# Patient Record
Sex: Female | Born: 1960 | Race: Black or African American | Hispanic: No | Marital: Single | Smoking: Never smoker
Health system: Southern US, Community
[De-identification: ages and names within clinical notes are randomized; demographics above are authoritative.]

## PROBLEM LIST (undated history)

## (undated) DIAGNOSIS — C801 Malignant (primary) neoplasm, unspecified: Secondary | ICD-10-CM

## (undated) HISTORY — PX: ABDOMINAL SURGERY: SHX537

## (undated) HISTORY — PX: ABDOMINAL HYSTERECTOMY: SHX81

---

## 2015-02-01 ENCOUNTER — Other Ambulatory Visit: Payer: Self-pay

## 2015-02-01 ENCOUNTER — Emergency Department (HOSPITAL_BASED_OUTPATIENT_CLINIC_OR_DEPARTMENT_OTHER): Payer: Medicaid Other

## 2015-02-01 ENCOUNTER — Emergency Department (HOSPITAL_BASED_OUTPATIENT_CLINIC_OR_DEPARTMENT_OTHER)
Admission: EM | Admit: 2015-02-01 | Discharge: 2015-02-01 | Disposition: A | Discharge status: Home / Self Care | Payer: Medicaid Other | Attending: Emergency Medicine | Admitting: Emergency Medicine

## 2015-02-01 ENCOUNTER — Encounter (HOSPITAL_BASED_OUTPATIENT_CLINIC_OR_DEPARTMENT_OTHER): Payer: Self-pay | Admitting: Emergency Medicine

## 2015-02-01 DIAGNOSIS — L509 Urticaria, unspecified: Secondary | ICD-10-CM | POA: Diagnosis not present

## 2015-02-01 DIAGNOSIS — Z8679 Personal history of other diseases of the circulatory system: Secondary | ICD-10-CM | POA: Insufficient documentation

## 2015-02-01 DIAGNOSIS — Z859 Personal history of malignant neoplasm, unspecified: Secondary | ICD-10-CM | POA: Insufficient documentation

## 2015-02-01 DIAGNOSIS — R0789 Other chest pain: Secondary | ICD-10-CM | POA: Insufficient documentation

## 2015-02-01 DIAGNOSIS — J029 Acute pharyngitis, unspecified: Secondary | ICD-10-CM | POA: Diagnosis present

## 2015-02-01 HISTORY — DX: Malignant (primary) neoplasm, unspecified: C80.1

## 2015-02-01 LAB — CBC WITH DIFFERENTIAL/PLATELET
BASOS PCT: 0 %
Basophils Absolute: 0 10*3/uL (ref 0.0–0.1)
EOS ABS: 0.1 10*3/uL (ref 0.0–0.7)
EOS PCT: 1 %
HCT: 37.3 % (ref 36.0–46.0)
Hemoglobin: 11.9 g/dL — ABNORMAL LOW (ref 12.0–15.0)
LYMPHS ABS: 3.5 10*3/uL (ref 0.7–4.0)
Lymphocytes Relative: 55 %
MCH: 28.7 pg (ref 26.0–34.0)
MCHC: 31.9 g/dL (ref 30.0–36.0)
MCV: 89.9 fL (ref 78.0–100.0)
MONOS PCT: 7 %
Monocytes Absolute: 0.5 10*3/uL (ref 0.1–1.0)
NEUTROS PCT: 37 %
Neutro Abs: 2.4 10*3/uL (ref 1.7–7.7)
PLATELETS: 220 10*3/uL (ref 150–400)
RBC: 4.15 MIL/uL (ref 3.87–5.11)
RDW: 13.5 % (ref 11.5–15.5)
WBC: 6.5 10*3/uL (ref 4.0–10.5)

## 2015-02-01 LAB — BASIC METABOLIC PANEL
ANION GAP: 5 (ref 5–15)
BUN: 13 mg/dL (ref 6–20)
CALCIUM: 8.7 mg/dL — AB (ref 8.9–10.3)
CO2: 26 mmol/L (ref 22–32)
CREATININE: 0.73 mg/dL (ref 0.44–1.00)
Chloride: 108 mmol/L (ref 101–111)
GLUCOSE: 98 mg/dL (ref 65–99)
Potassium: 3.6 mmol/L (ref 3.5–5.1)
Sodium: 139 mmol/L (ref 135–145)

## 2015-02-01 LAB — RAPID STREP SCREEN (MED CTR MEBANE ONLY): STREPTOCOCCUS, GROUP A SCREEN (DIRECT): NEGATIVE

## 2015-02-01 LAB — TROPONIN I: Troponin I: <0.03 ng/mL (ref <0.031)

## 2015-02-01 MED ORDER — PREDNISONE 50 MG PO TABS
60.0000 mg | ORAL_TABLET | Freq: Once | ORAL | Status: AC
  Administered 2015-02-01: 60 mg via ORAL
  Filled 2015-02-01 (×2): qty 1

## 2015-02-01 MED ORDER — DIPHENHYDRAMINE HCL 25 MG PO TABS
50.0000 mg | ORAL_TABLET | ORAL | Status: AC | PRN
Start: 2015-02-01 — End: ?

## 2015-02-01 MED ORDER — DIPHENHYDRAMINE HCL 25 MG PO CAPS
50.0000 mg | ORAL_CAPSULE | Freq: Once | ORAL | Status: AC
  Administered 2015-02-01: 50 mg via ORAL
  Filled 2015-02-01: qty 2

## 2015-02-01 MED ORDER — PREDNISONE 20 MG PO TABS: ORAL_TABLET | ORAL | Status: AC

## 2015-02-01 NOTE — ED Provider Notes (Signed)
CSN: [id number]     Arrival date & time 02/01/15  2028 History  By signing my name below, I, Terrance Branch, attest that this documentation has been prepared under the direction and in the presence of Charlesetta Shanks, MD. Electronically Signed: Randa Evens, ED Scribe. 02/01/2015. 11:04 PM.      Chief Complaint  Patient presents with  . Sore Throat    Patient is a 54 y.o. female presenting with pharyngitis. The history is provided by the patient. No language interpreter was used.  Sore Throat   HPI Comments: Julie Franklin is a 54 y.o. female who presents to the Emergency Department complaining of sore throat onset today. Pt states that the left side her of neck is swollen and that it feels like" my throat is closing." She states that today while she was in church she had a sudden burning pain on the left side of her neck. Pt reports associated voice change. Pt denies any injury or trauma to her neck. Pt denies wearing any clothing or jewelry around her neck today. Pt reports that for the past 2-3 days she has had some chest pressure described as a heaviness. Pt reports having associated radiating arm pain as well. Pt reports having a productive cough as well. Pt doesn't report any alleviating or worsening factors. Denies fever, chills, congestion, sinus pressure, nausea. Hx of pericarditis. Denies family HX of early CAD   Past Medical History  Diagnosis Date  . Cancer [facility])    Past Surgical History  Procedure Laterality Date  . Abdominal surgery    . Abdominal hysterectomy     History reviewed. No pertinent family history. Social History  Substance Use Topics  . Smoking status: Never Smoker   . Smokeless tobacco: None  . Alcohol Use: No   OB History    No data available     Review of Systems 10 Systems reviewed and all are negative for acute change except as noted in the HPI.   Allergies  Dilaudid  Home Medications   Prior to Admission medications   Medication Sig  Start Date End Date Taking? Authorizing Provider  diphenhydrAMINE (BENADRYL) 25 MG tablet Take 2 tablets (50 mg total) by mouth every 4 (four) hours as needed for itching. 02/01/15   Charlesetta Shanks, MD  predniSONE (DELTASONE) 20 MG tablet 3 tabs po day one, then 2 po daily x 4 days 02/01/15   Charlesetta Shanks, MD   BP 147/74 mmHg  Pulse 50  Temp(Src) 98.5 F (36.9 C) (Oral)  Resp 17  Ht 5\' 10"  (1.778 m)  Wt 260 lb (117.935 kg)  BMI 37.31 kg/m2  SpO2 99%   Physical Exam  Constitutional: She is oriented to person, place, and time. She appears well-developed and well-nourished.  HENT:  Head: Normocephalic and atraumatic.  Nose: Nose normal.  Mouth/Throat: Oropharynx is clear and moist. No oropharyngeal exudate.  Eyes: EOM are normal. Pupils are equal, round, and reactive to light.  Neck: Neck supple.  Patient has urticarial lesions on the side of her neck. See attached images.  Cardiovascular: Normal rate, regular rhythm, normal heart sounds and intact distal pulses.   Pulmonary/Chest: Effort normal and breath sounds normal.  Abdominal: Soft. Bowel sounds are normal. She exhibits no distension. There is no tenderness.  Musculoskeletal: Normal range of motion. She exhibits no edema or tenderness.  Neurological: She is alert and oriented to person, place, and time. She has normal strength. No cranial nerve deficit. She exhibits normal muscle tone. Coordination  normal. GCS eye subscore is 4. GCS verbal subscore is 5. GCS motor subscore is 6.  Skin: Skin is warm, dry and intact.  Psychiatric: She has a normal mood and affect.             ED Course  Procedures (including critical care time) DIAGNOSTIC STUDIES: Oxygen Saturation is 100% on RA, normal by my interpretation.    COORDINATION OF CARE: 11:04 PM-Discussed treatment plan with pt at bedside and pt agreed to plan.     Labs Review Labs Reviewed  BASIC METABOLIC PANEL - Abnormal; Notable for the following:    Calcium 8.7  (*)    All other components within normal limits  CBC WITH DIFFERENTIAL/PLATELET - Abnormal; Notable for the following:    Hemoglobin 11.9 (*)    All other components within normal limits  RAPID STREP SCREEN (NOT AT [facility])  CULTURE, GROUP A STREP  TROPONIN I    Imaging Review No results found.    EKG Interpretation None      MDM   Final diagnoses:  Urticaria  Other chest pain   Patient had a throat complaint initially. On examination the areas on her neck are consistent with urticarial type of lesions. There are soft and there is no diffuse induration to suggest angioedema. Not on any medications. She denies any trauma to her neck or any known contact. At this time findings are consistent with an allergic reaction of unknown etiology.  Patient had a secondary complaint stating that she's felt chest pressure for the past 2 days. She endorses some cough but no fever. Her lungs are clear. He does not have ill appearance or findings suggestive of acute infectious etiology. She does not have risk factors for cardiac disease. The patient is a nonsmoker, she does not have history of diabetes, hypertension or positive family history. Cardiac enzymes are negative and EKG does not show any ischemic ischemic appearance. She does report she had a history of pericarditis and number of years ago, so anytime she gets chest discomfort she is worried that that is come back. At this time there is no indication that she has pericarditis. Her heart sounds are normal and blood pressure stable. Patient is advised at this time to follow-up with her cardiologist this week for reevaluation to the emergency department for any worsening or changing symptoms.     Charlesetta Shanks, MD 02/01/15 [phone]

## 2015-02-01 NOTE — Discharge Instructions (Signed)
Hives Hives are itchy, red, swollen areas of the skin. They can vary in size and location on your body. Hives can come and go for hours or several days (acute hives) or for several weeks (chronic hives). Hives do not spread from person to person (noncontagious). They may get worse with scratching, exercise, and emotional stress. CAUSES   Allergic reaction to food, additives, or drugs.  Infections, including the common cold.  Illness, such as vasculitis, lupus, or thyroid disease.  Exposure to sunlight, heat, or cold.  Exercise.  Stress.  Contact with chemicals. SYMPTOMS   Red or white swollen patches on the skin. The patches may change size, shape, and location quickly and repeatedly.  Itching.  Swelling of the hands, feet, and face. This may occur if hives develop deeper in the skin. DIAGNOSIS  Your caregiver can usually tell what is wrong by performing a physical exam. Skin or blood tests may also be done to determine the cause of your hives. In some cases, the cause cannot be determined. TREATMENT  Mild cases usually get better with medicines such as antihistamines. Severe cases may require an emergency epinephrine injection. If the cause of your hives is known, treatment includes avoiding that trigger.  HOME CARE INSTRUCTIONS   Avoid causes that trigger your hives.  Take antihistamines as directed by your caregiver to reduce the severity of your hives. Non-sedating or low-sedating antihistamines are usually recommended. Do not drive while taking an antihistamine.  Take any other medicines prescribed for itching as directed by your caregiver.  Wear loose-fitting clothing.  Keep all follow-up appointments as directed by your caregiver. SEEK MEDICAL CARE IF:   You have persistent or severe itching that is not relieved with medicine.  You have painful or swollen joints. SEEK IMMEDIATE MEDICAL CARE IF:   You have a fever.  Your tongue or lips are swollen.  You have  trouble breathing or swallowing.  You feel tightness in the throat or chest.  You have abdominal pain. These problems may be the first sign of a life-threatening allergic reaction. Call your local emergency services (911 in [place] MAKE SURE YOU:   Understand these instructions.  Will watch your condition.  Will get help right away if you are not doing well or get worse.   This information is not intended to replace advice given to you by your health care provider. Make sure you discuss any questions you have with your health care provider.   Document Released: 03/14/2005 Document Revised: 03/19/2013 Document Reviewed: 06/07/2011 Elsevier Interactive Patient Education 2016 [employer] Nonspecific Chest Pain  Chest pain can be caused by many different conditions. There is always a chance that your pain could be related to something serious, such as a heart attack or a blood clot in your lungs. Chest pain can also be caused by conditions that are not life-threatening. If you have chest pain, it is very important to follow up with your health care provider. CAUSES  Chest pain can be caused by:  Heartburn.  Pneumonia or bronchitis.  Anxiety or stress.  Inflammation around your heart (pericarditis) or lung (pleuritis or pleurisy).  A blood clot in your lung.  A collapsed lung (pneumothorax). It can develop suddenly on its own (spontaneous pneumothorax) or from trauma to the chest.  Shingles infection (varicella-zoster virus).  Heart attack.  Damage to the bones, muscles, and cartilage that make up your chest wall. This can include:  Bruised bones due to injury.  Strained muscles or cartilage  due to frequent or repeated coughing or overwork.  Fracture to one or more ribs.  Sore cartilage due to inflammation (costochondritis). RISK FACTORS  Risk factors for chest pain may include:  Activities that increase your risk for trauma or injury to your chest.  Respiratory  infections or conditions that cause frequent coughing.  Medical conditions or overeating that can cause heartburn.  Heart disease or family history of heart disease.  Conditions or health behaviors that increase your risk of developing a blood clot.  Having had chicken pox (varicella zoster). SIGNS AND SYMPTOMS Chest pain can feel like:  Burning or tingling on the surface of your chest or deep in your chest.  Crushing, pressure, aching, or squeezing pain.  Dull or sharp pain that is worse when you move, cough, or take a deep breath.  Pain that is also felt in your back, neck, shoulder, or arm, or pain that spreads to any of these areas. Your chest pain may come and go, or it may stay constant. DIAGNOSIS Lab tests or other studies may be needed to find the cause of your pain. Your health care provider may have you take a test called an ambulatory ECG (electrocardiogram). An ECG records your heartbeat patterns at the time the test is performed. You may also have other tests, such as:  Transthoracic echocardiogram (TTE). During echocardiography, sound waves are used to create a picture of all of the heart structures and to look at how blood flows through your heart.  Transesophageal echocardiogram (TEE).This is a more advanced imaging test that obtains images from inside your body. It allows your health care provider to see your heart in finer detail.  Cardiac monitoring. This allows your health care provider to monitor your heart rate and rhythm in real time.  Holter monitor. This is a portable device that records your heartbeat and can help to diagnose abnormal heartbeats. It allows your health care provider to track your heart activity for several days, if needed.  Stress tests. These can be done through exercise or by taking medicine that makes your heart beat more quickly.  Blood tests.  Imaging tests. TREATMENT  Your treatment depends on what is causing your chest pain.  Treatment may include:  Medicines. These may include:  Acid blockers for heartburn.  Anti-inflammatory medicine.  Pain medicine for inflammatory conditions.  Antibiotic medicine, if an infection is present.  Medicines to dissolve blood clots.  Medicines to treat coronary artery disease.  Supportive care for conditions that do not require medicines. This may include:  Resting.  Applying heat or cold packs to injured areas.  Limiting activities until pain decreases. HOME CARE INSTRUCTIONS  If you were prescribed an antibiotic medicine, finish it all even if you start to feel better.  Avoid any activities that bring on chest pain.  Do not use any tobacco products, including cigarettes, chewing tobacco, or electronic cigarettes. If you need help quitting, ask your health care provider.  Do not drink alcohol.  Take medicines only as directed by your health care provider.  Keep all follow-up visits as directed by your health care provider. This is important. This includes any further testing if your chest pain does not go away.  If heartburn is the cause for your chest pain, you may be told to keep your head raised (elevated) while sleeping. This reduces the chance that acid will go from your stomach into your esophagus.  Make lifestyle changes as directed by your health care provider. These may  include:  Getting regular exercise. Ask your health care provider to suggest some activities that are safe for you.  Eating a heart-healthy diet. A registered dietitian can help you to learn healthy eating options.  Maintaining a healthy weight.  Managing diabetes, if necessary.  Reducing stress. SEEK MEDICAL CARE IF:  Your chest pain does not go away after treatment.  You have a rash with blisters on your chest.  You have a fever. SEEK IMMEDIATE MEDICAL CARE IF:   Your chest pain is worse.  You have an increasing cough, or you cough up blood.  You have severe  abdominal pain.  You have severe weakness.  You faint.  You have chills.  You have sudden, unexplained chest discomfort.  You have sudden, unexplained discomfort in your arms, back, neck, or jaw.  You have shortness of breath at any time.  You suddenly start to sweat, or your skin gets clammy.  You feel nauseous or you vomit.  You suddenly feel light-headed or dizzy.  Your heart begins to beat quickly, or it feels like it is skipping beats. These symptoms may represent a serious problem that is an emergency. Do not wait to see if the symptoms will go away. Get medical help right away. Call your local emergency services (911 in the [place] Do not drive yourself to the hospital.   This information is not intended to replace advice given to you by your health care provider. Make sure you discuss any questions you have with your health care provider.   Document Released: 12/22/2004 Document Revised: 04/04/2014 Document Reviewed: 10/18/2013 Elsevier Interactive Patient Education [employer].

## 2015-02-01 NOTE — ED Notes (Signed)
Patient has about 2 -3 areas of swelling and redness noted to her left thoart that she reports started this am. The patient reports that he voice is "leaving" and she has a HA now.

## 2015-02-04 LAB — CULTURE, GROUP A STREP: Strep A Culture: NEGATIVE

## 2017-07-14 IMAGING — DX DG CHEST 2V
2 series · 2 of 2 positions shown · non-contrast
Comparison: None.

CLINICAL DATA: Sore throat beginning today, LEFT neck swelling,
throat closing. Voice change. History of cancer.

EXAM:
CHEST  2 VIEW

[chest pa]
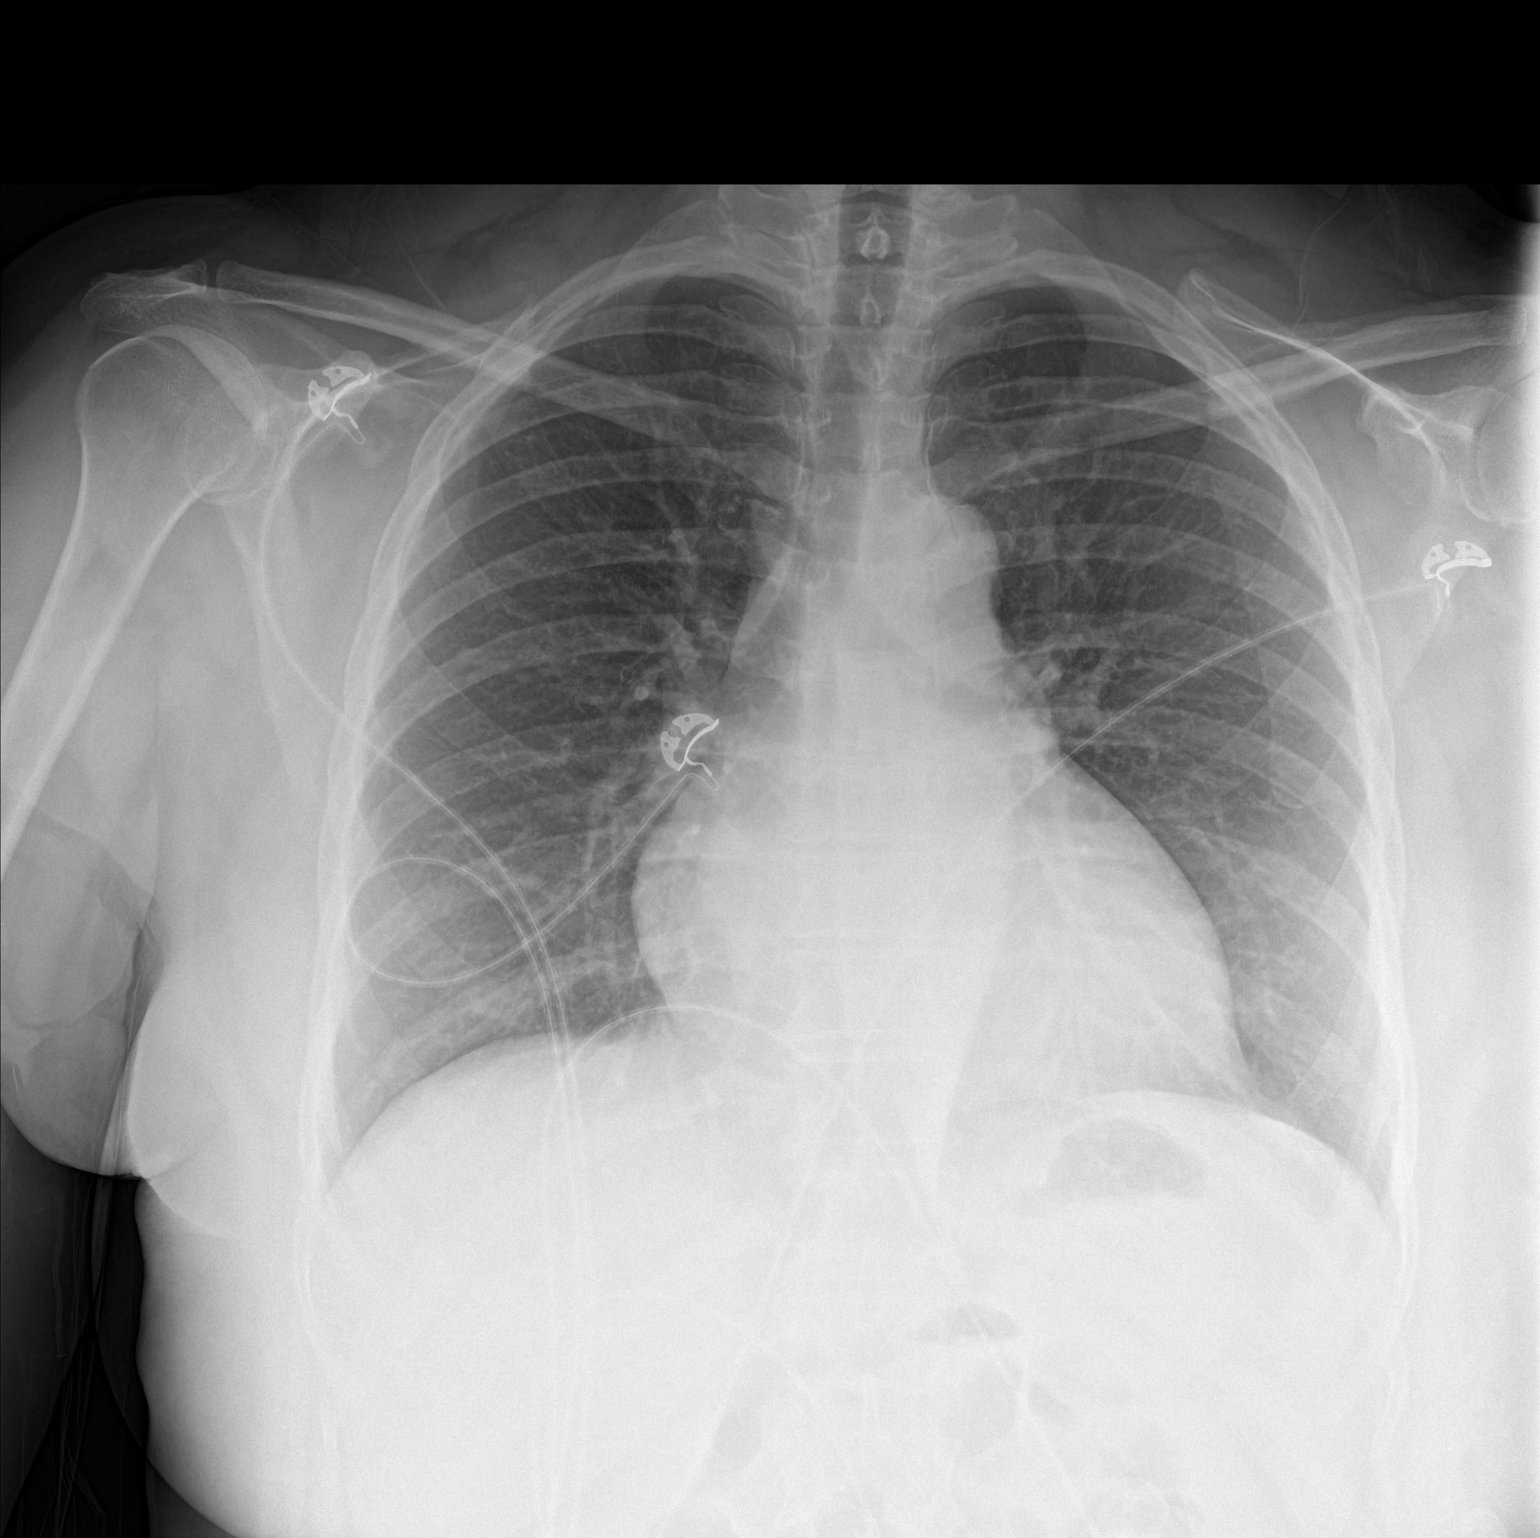

[chest lat]
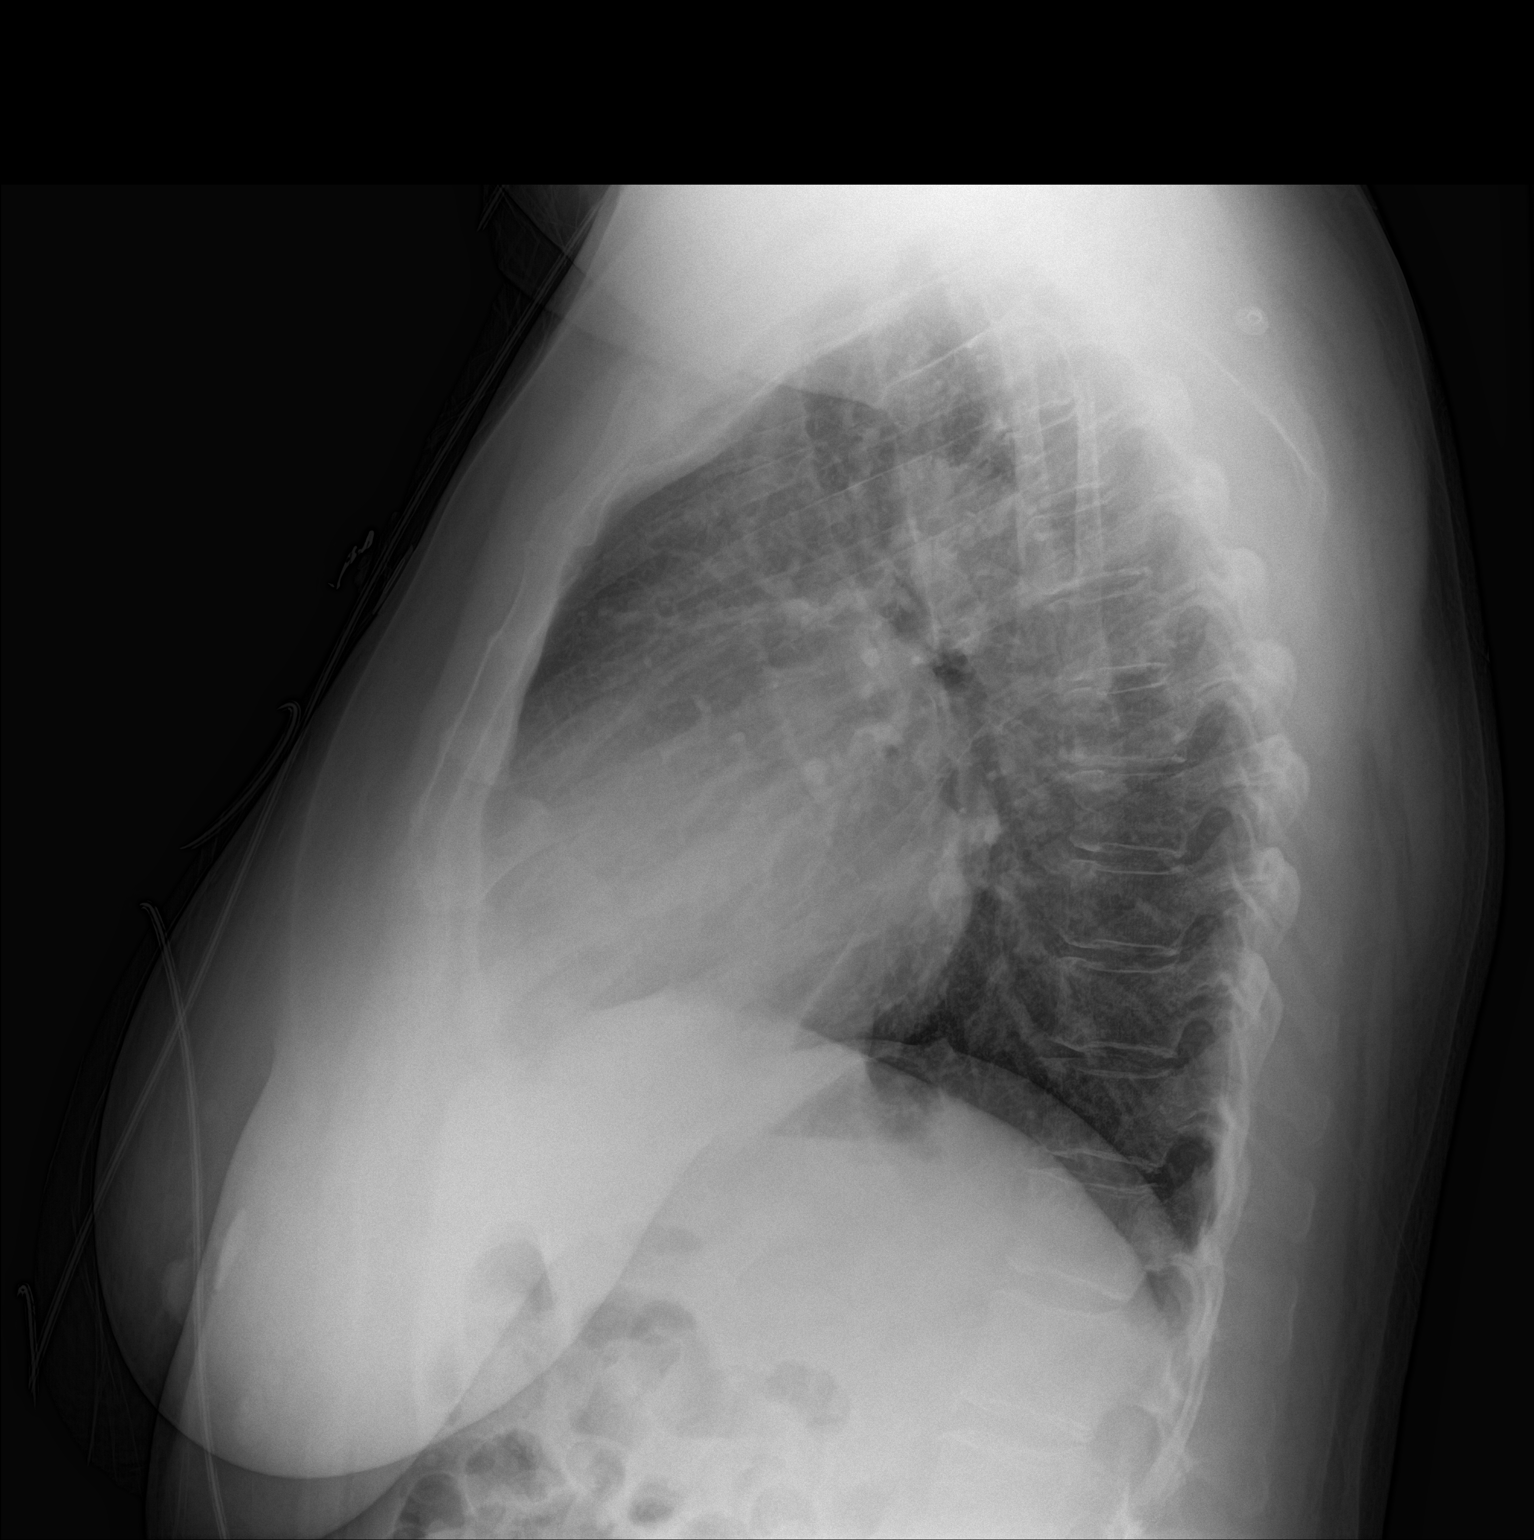

[2 of 2 positions shown; findings below may reference images not displayed]

FINDINGS: Cardiac silhouette is upper limits of normal in size, mediastinal
silhouette is normal. The lungs are clear without pleural effusions
or focal consolidations. Trachea projects midline and there is no
pneumothorax. Soft tissue planes and included osseous structures are
non-suspicious.
IMPRESSION: Borderline cardiomegaly.  No acute pulmonary process.
